# Patient Record
Sex: Male | Born: 1965 | Race: White | Hispanic: No | Smoking: Never smoker
Health system: Southern US, Community
[De-identification: ages and names within clinical notes are randomized; demographics above are authoritative.]

## PROBLEM LIST (undated history)

## (undated) DIAGNOSIS — Z9109 Other allergy status, other than to drugs and biological substances: Secondary | ICD-10-CM

## (undated) DIAGNOSIS — K219 Gastro-esophageal reflux disease without esophagitis: Secondary | ICD-10-CM

---

## 2017-11-28 ENCOUNTER — Encounter (HOSPITAL_BASED_OUTPATIENT_CLINIC_OR_DEPARTMENT_OTHER): Payer: Self-pay | Admitting: Emergency Medicine

## 2017-11-28 ENCOUNTER — Emergency Department (HOSPITAL_BASED_OUTPATIENT_CLINIC_OR_DEPARTMENT_OTHER): Payer: Managed Care, Other (non HMO)

## 2017-11-28 ENCOUNTER — Other Ambulatory Visit: Payer: Self-pay

## 2017-11-28 ENCOUNTER — Emergency Department (HOSPITAL_BASED_OUTPATIENT_CLINIC_OR_DEPARTMENT_OTHER)
Admission: EM | Admit: 2017-11-28 | Discharge: 2017-11-28 | Disposition: A | Discharge status: Home / Self Care | Payer: Managed Care, Other (non HMO) | Attending: Emergency Medicine | Admitting: Emergency Medicine

## 2017-11-28 DIAGNOSIS — R1013 Epigastric pain: Secondary | ICD-10-CM

## 2017-11-28 HISTORY — DX: Gastro-esophageal reflux disease without esophagitis: K21.9

## 2017-11-28 HISTORY — DX: Other allergy status, other than to drugs and biological substances: Z91.09

## 2017-11-28 LAB — CBC
HEMATOCRIT: 43.2 % (ref 39.0–52.0)
Hemoglobin: 14.3 g/dL (ref 13.0–17.0)
MCH: 28.7 pg (ref 26.0–34.0)
MCHC: 33.1 g/dL (ref 30.0–36.0)
MCV: 86.7 fL (ref 80.0–100.0)
NRBC: 0 % (ref 0.0–0.2)
Platelets: 229 10*3/uL (ref 150–400)
RBC: 4.98 MIL/uL (ref 4.22–5.81)
RDW: 12.7 % (ref 11.5–15.5)
WBC: 8.2 10*3/uL (ref 4.0–10.5)

## 2017-11-28 LAB — COMPREHENSIVE METABOLIC PANEL
ALT: 20 U/L (ref 0–44)
ANION GAP: 9 (ref 5–15)
AST: 17 U/L (ref 15–41)
Albumin: 4.2 g/dL (ref 3.5–5.0)
Alkaline Phosphatase: 63 U/L (ref 38–126)
BUN: 11 mg/dL (ref 6–20)
CHLORIDE: 104 mmol/L (ref 98–111)
CO2: 25 mmol/L (ref 22–32)
Calcium: 9.2 mg/dL (ref 8.9–10.3)
Creatinine, Ser: 0.94 mg/dL (ref 0.61–1.24)
GFR calc non Af Amer: >60 mL/min (ref >60)
Glucose, Bld: 96 mg/dL (ref 70–99)
POTASSIUM: 3.6 mmol/L (ref 3.5–5.1)
SODIUM: 138 mmol/L (ref 135–145)
Total Bilirubin: 0.8 mg/dL (ref 0.3–1.2)
Total Protein: 7.1 g/dL (ref 6.5–8.1)

## 2017-11-28 LAB — URINALYSIS, ROUTINE W REFLEX MICROSCOPIC
Bilirubin Urine: NEGATIVE
Glucose, UA: NEGATIVE mg/dL
Hgb urine dipstick: NEGATIVE
Ketones, ur: NEGATIVE mg/dL
LEUKOCYTES UA: NEGATIVE
NITRITE: NEGATIVE
PH: 6 (ref 5.0–8.0)
PROTEIN: NEGATIVE mg/dL
Specific Gravity, Urine: 1.02 (ref 1.005–1.030)

## 2017-11-28 LAB — LIPASE, BLOOD: LIPASE: 32 U/L (ref 11–51)

## 2017-11-28 MED ORDER — SODIUM CHLORIDE 0.9 % IV BOLUS
500.0000 mL | Freq: Once | INTRAVENOUS | Status: AC
  Administered 2017-11-28: 500 mL via INTRAVENOUS

## 2017-11-28 MED ORDER — IOPAMIDOL (ISOVUE-300) INJECTION 61%
100.0000 mL | Freq: Once | INTRAVENOUS | Status: AC | PRN
  Administered 2017-11-28: 100 mL via INTRAVENOUS

## 2017-11-28 NOTE — ED Triage Notes (Signed)
Patient states that he woke up earlier this am with epigastric pain - reports that he is nauseated  - patient has had dizziness - cold chills

## 2017-11-28 NOTE — ED Notes (Signed)
PT transported to CT>

## 2017-11-28 NOTE — Discharge Instructions (Signed)
Continue taking GERD medications as prescribed.  You may try tums or maalox for symptom control.  Use tylenol as needed for pain.  Follow up with your primary care doctor as needed for further evaluation.  Return to the ER if you develop high fevers, persistent vomiting, blood in your stool, or any new or concerning symptoms.

## 2017-11-28 NOTE — ED Provider Notes (Signed)
[facility] EMERGENCY DEPARTMENT Provider Note   CSN: [id number] Arrival date & time: 11/28/17  1533     History   Chief Complaint Chief Complaint  Patient presents with  . Abdominal Pain    HPI Carlota RaspberryGeorge Balfour is a 52 y.o. male presenting for evaluation of epigastric abdominal pain.  Patient states he woke up this morning, he had severe epigastric abdominal pain.  Since then, has been constant.  He reports associated nausea but no vomiting.  He reports chills but no fever.  He states his left ear has been hurting today.  He feels "off."  He denies chest pain or shortness of breath.  He denies lower abdominal pain.  Denies urinary symptoms or abnormal bowel movements.  Patient states he has a history of GERD, takes pantoprazole daily.  Last night he did eat oatmeal just before going to bed, which she normally tries not to eat before sleeping.  He denies tobacco, alcohol, or drug use.  He has a history of an umbilical hernia, no other history of abdominal problems.  He denies history of abdominal surgeries.  Besides GERD, he has no other medical problems, takes no other medications daily.  HPI  Past Medical History:  Diagnosis Date  . Chronic GERD   . Environmental allergies     There are no active problems to display for this patient.   History reviewed. No pertinent surgical history.      Home Medications    Prior to Admission medications   Not on File    Family History History reviewed. No pertinent family history.  Social History Social History   Tobacco Use  . Smoking status: Never Smoker  . Smokeless tobacco: Never Used  Substance Use Topics  . Alcohol use: Never    Frequency: Never  . Drug use: Never     Allergies   Bactrim [sulfamethoxazole-trimethoprim]   Review of Systems Review of Systems  Constitutional: Positive for chills.  HENT: Positive for ear pain.   Gastrointestinal: Positive for abdominal pain and nausea.  All other  systems reviewed and are negative.    Physical Exam Updated Vital Signs BP (!) 145/85 (BP Location: Right Arm)   Pulse 61   Temp 98.3 F (36.8 C) (Oral)   Resp 18   Ht 5\' 9"  (1.753 m)   Wt 73.5 kg   SpO2 100%   BMI 23.92 kg/m   Physical Exam  Constitutional: He is oriented to person, place, and time. He appears well-developed and well-nourished. No distress.  Sitting comfortably in the bed in no acute distress  HENT:  Head: Normocephalic and atraumatic.  Right Ear: Tympanic membrane is not erythematous and not bulging. A middle ear effusion is present.  Left Ear: Tympanic membrane is not erythematous and not bulging. A middle ear effusion is present.  Bilateral ear effusions without erythema or bulging.  OP clear without tonsillar swelling or exudate.  Uvula midline with palate rise.  Eyes: Pupils are equal, round, and reactive to light. Conjunctivae and EOM are normal.  Neck: Normal range of motion. Neck supple.  Cardiovascular: Normal rate, regular rhythm and intact distal pulses.  Pulmonary/Chest: Effort normal and breath sounds normal. No respiratory distress. He has no wheezes.  Abdominal: Soft. He exhibits no distension and no mass. There is no tenderness. There is no rebound and no guarding. A hernia is present.  No tenderness palpation of the abdomen.  Soft without rigidity, guarding, distention.  No masses.  Umbilical hernia soft and  reducible.  Negative rebound.  Musculoskeletal: Normal range of motion.  Neurological: He is alert and oriented to person, place, and time.  Skin: Skin is warm and dry. Capillary refill takes less than 2 seconds.  Psychiatric: He has a normal mood and affect.  Nursing note and vitals reviewed.    ED Treatments / Results  Labs (all labs ordered are listed, but only abnormal results are displayed) Labs Reviewed  LIPASE, BLOOD  COMPREHENSIVE METABOLIC PANEL  CBC  URINALYSIS, ROUTINE W REFLEX MICROSCOPIC    EKG None  Radiology Ct  Abdomen Pelvis W Contrast  Result Date: 11/28/2017 CLINICAL DATA:  Abdominal infection, nausea, vomiting EXAM: CT ABDOMEN AND PELVIS WITH CONTRAST TECHNIQUE: Multidetector CT imaging of the abdomen and pelvis was performed using the standard protocol following bolus administration of intravenous contrast. CONTRAST:  ISOVUE-300 IOPAMIDOL (ISOVUE-300) INJECTION 61% COMPARISON:  None. FINDINGS: Lower chest: Lung bases are clear. No effusions. Heart is normal size. Hepatobiliary: Low-density area in the right hepatic lobe appears to have peripheral contrast enhancement and most likely reflects a hemangioma. Gallbladder unremarkable. No biliary ductal dilatation. Pancreas: No focal abnormality or ductal dilatation. Spleen: No focal abnormality.  Normal size. Adrenals/Urinary Tract: No adrenal abnormality. No focal renal abnormality. No stones or hydronephrosis. Urinary bladder is unremarkable. Stomach/Bowel: Stomach, large and small bowel grossly unremarkable. Appendix not definitively seen. No inflammatory process in the right lower quadrant. Vascular/Lymphatic: No evidence of aneurysm or adenopathy. Reproductive: No visible focal abnormality. Other: No free fluid or free air. Musculoskeletal: No acute bony abnormality. IMPRESSION: 1.6 cm low-density lesion in the right hepatic lobe, favor hemangioma. This could be confirmed with elective right upper quadrant ultrasound. No acute findings in the abdomen or pelvis. Appendix not definitively seen, but no pericecal inflammation visualized. Electronically Signed   By: Charlett Nose M.D.   On: 11/28/2017 18:27    Procedures Procedures (including critical care time)  Medications Ordered in ED Medications  sodium chloride 0.9 % bolus 500 mL (0 mLs Intravenous Stopped 11/28/17 1807)  iopamidol (ISOVUE-300) 61 % injection 100 mL (100 mLs Intravenous Contrast Given 11/28/17 1812)     Initial Impression / Assessment and Plan / ED Course  I have reviewed the  triage vital signs and the nursing notes.  Pertinent labs & imaging results that were available during my care of the patient were reviewed by me and considered in my medical decision making (see chart for details).     Patient presenting for evaluation of abdominal pain and nausea.  Physical exam reassuring, he is afebrile and not tachycardic.  Appears nontoxic.  No tenderness palpation of the abdomen.  Patient does not want anything for pain or for nausea at this time.  Will obtain basic abdominal labs, and CT abdomen pelvis for further evaluation.  Consider pancreatitis versus gastritis versus reflux versus PUD versus obstruction from the hernia.  Labs reassuring, kidney, liver, pancreatic function reassuring.  No leukocytosis.  Urine without infection.  CT abdomen pelvis pending.  CT negative for intra-abdominal infection, perforation, or obstruction.  Appendix is not visible, but no surrounding peri-appendical fluid.  Pain is of the epigastric, not right lower quadrant, low suspicion for appendicitis.  Discussed findings with patient.  Discussed that this may be gastritis versus PUD versus worsening GERD.  Discussed symptomatic control with Maalox or Tums for breakthrough pain, and follow-up with primary care for further evaluation.  Tolerating p.o. without difficulty.  At this time, patient appears safe for safe discharge.  Return precautions given.  Patient states he understands and agrees plan.  Final Clinical Impressions(s) / ED Diagnoses   Final diagnoses:  Epigastric abdominal pain    ED Discharge Orders    None       Alveria Apley, PA-C 11/28/17 1912    Virgina Norfolk, DO 11/28/17 2357

## 2019-03-04 ENCOUNTER — Ambulatory Visit: Payer: Managed Care, Other (non HMO) | Attending: Internal Medicine

## 2019-03-04 DIAGNOSIS — Z23 Encounter for immunization: Secondary | ICD-10-CM | POA: Insufficient documentation

## 2019-03-04 NOTE — Progress Notes (Signed)
   Covid-19 Vaccination Clinic  Name:  Bryant Saye    MRN: [id number] DOB: 1965/03/29  03/04/2019  Mr. Goza was observed post Covid-19 immunization for 15 minutes without incidence. He was provided with Vaccine Information Sheet and instruction to access the V-Safe system.   Mr. Girgis was instructed to call 911 with any severe reactions post vaccine: Marland Kitchen Difficulty breathing  . Swelling of your face and throat  . A fast heartbeat  . A bad rash all over your body  . Dizziness and weakness    Immunizations Administered    Name Date Dose VIS Date Route   Pfizer COVID-19 Vaccine 03/04/2019 11:01 AM 0.3 mL 12/24/2018 Intramuscular   Manufacturer: [employer], [employer]   Lot: [id number]   NDC: [address]-7

## 2019-03-28 ENCOUNTER — Ambulatory Visit: Payer: Managed Care, Other (non HMO) | Attending: Internal Medicine

## 2019-03-28 DIAGNOSIS — Z23 Encounter for immunization: Secondary | ICD-10-CM

## 2019-03-28 NOTE — Progress Notes (Signed)
   Covid-19 Vaccination Clinic  Name:  Troy Richards    MRN: [id number] DOB: January 12, 1966  03/28/2019  Troy Richards was observed post Covid-19 immunization for 15 minutes without incident. He was provided with Vaccine Information Sheet and instruction to access the V-Safe system.   Troy Richards was instructed to call 911 with any severe reactions post vaccine: Marland Kitchen Difficulty breathing  . Swelling of face and throat  . A fast heartbeat  . A bad rash all over body  . Dizziness and weakness   Immunizations Administered    Name Date Dose VIS Date Route   Pfizer COVID-19 Vaccine 03/28/2019  9:25 AM 0.3 mL 12/24/2018 Intramuscular   Manufacturer: [employer], [employer]   Lot: [id number]   NDC: [address]-9

## 2019-12-01 IMAGING — CT CT ABD-PELV W/ CM
2 of 5 series · 16 of 46 positions shown, 18 images · IV contrast (APPLIED)
Comparison: None.

CLINICAL DATA: Abdominal infection, nausea, vomiting

EXAM:
CT ABDOMEN AND PELVIS WITH CONTRAST
TECHNIQUE: Multidetector CT imaging of the abdomen and pelvis was performed
using the standard protocol following bolus administration of
intravenous contrast.
CONTRAST:  100mL [id number] IOPAMIDOL ([id number]-[id number]) INJECTION 61%

[Series 2: axial st · axial · 0.75mm/px · z∈[-414,-24]mm · 13 of 88 slices shown, 15 images]
[im 5/88  soft-tissue]
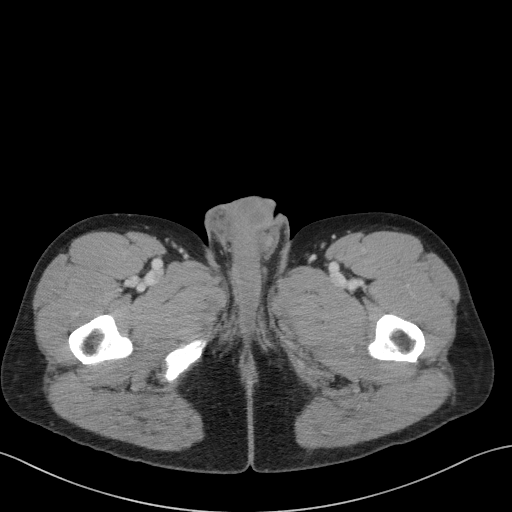
[im 5/88  bone]
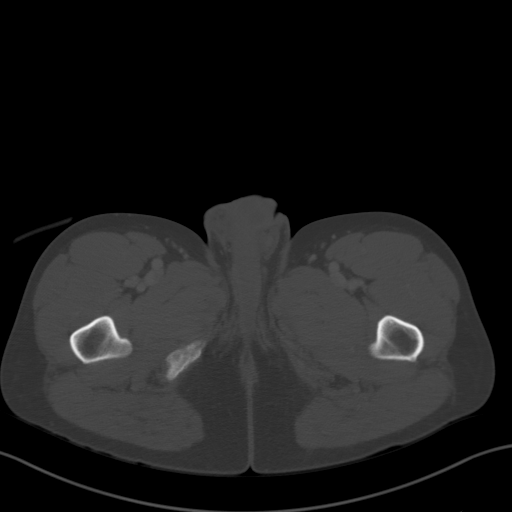
[im 14/88  soft-tissue]
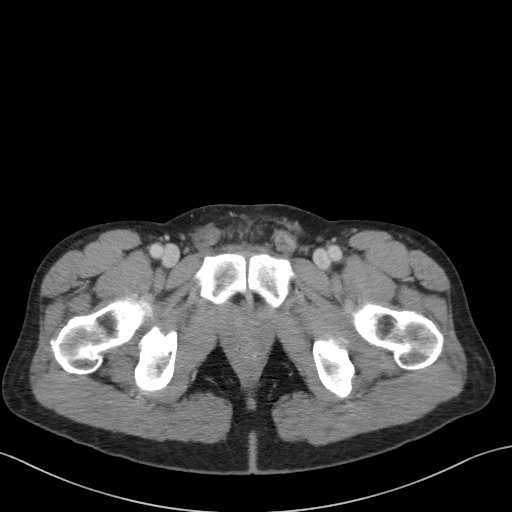
[im 19/88  soft-tissue]
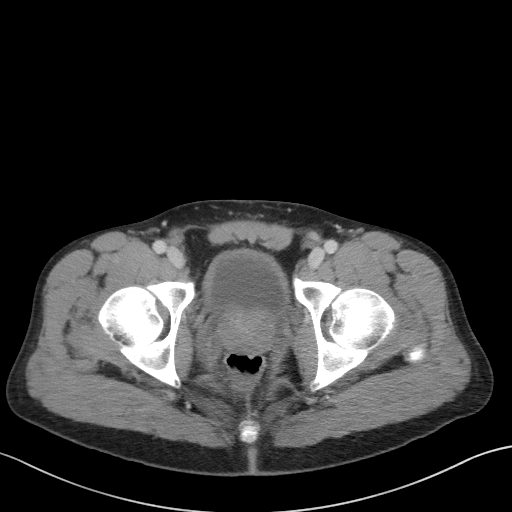
[im 23/88  soft-tissue]
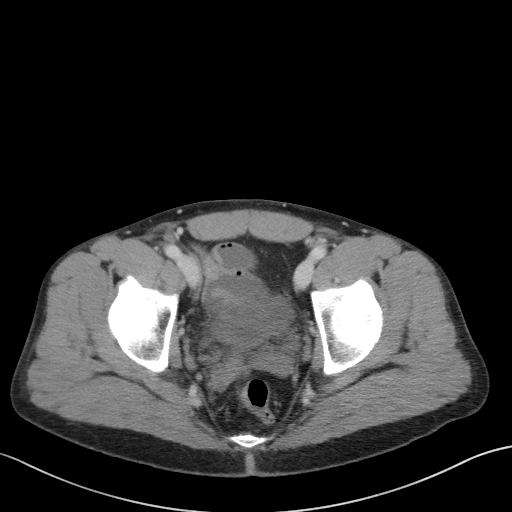
[im 33/88  soft-tissue]
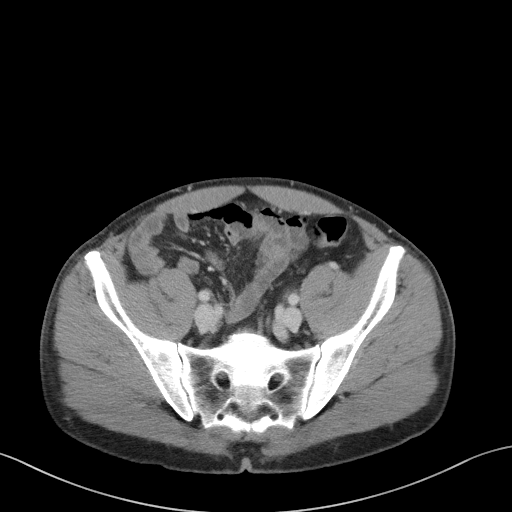
[im 37/88  soft-tissue]
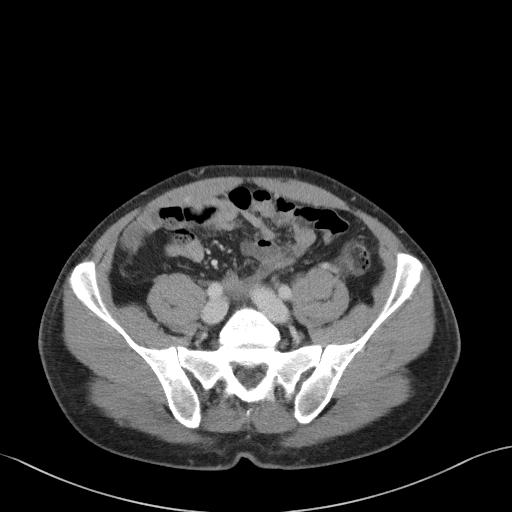
[im 46/88  soft-tissue]
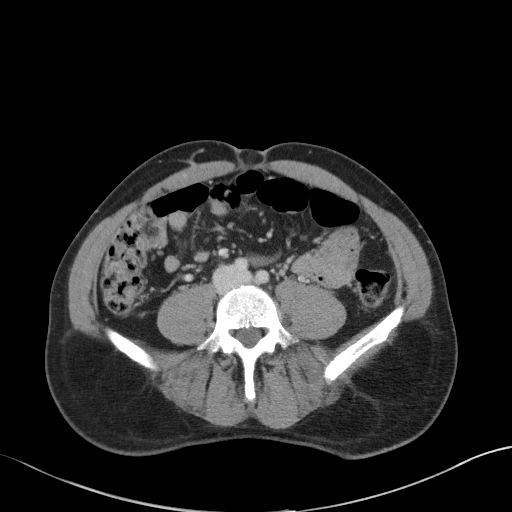
[im 51/88  soft-tissue]
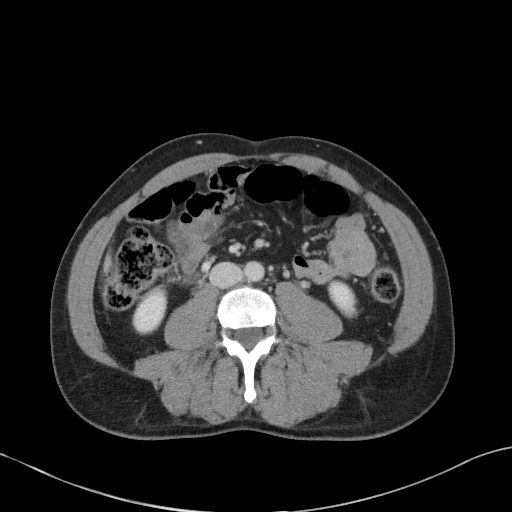
[im 55/88  soft-tissue]
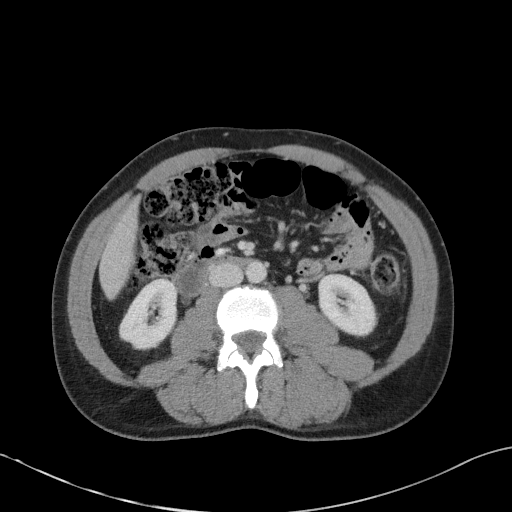
[im 55/88  bone]
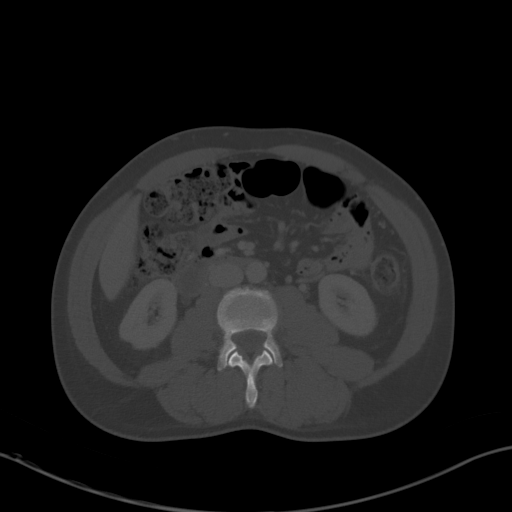
[im 65/88  soft-tissue]
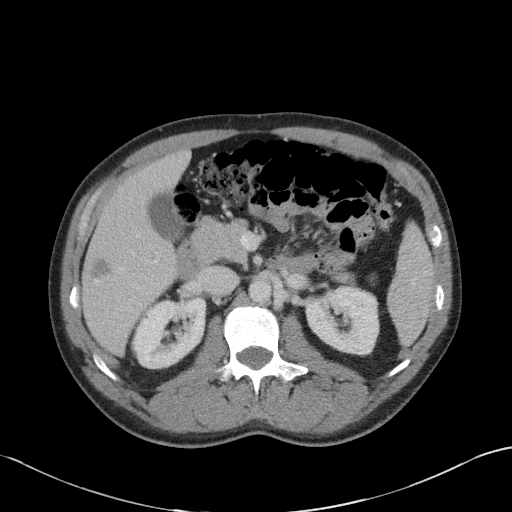
[im 69/88  soft-tissue]
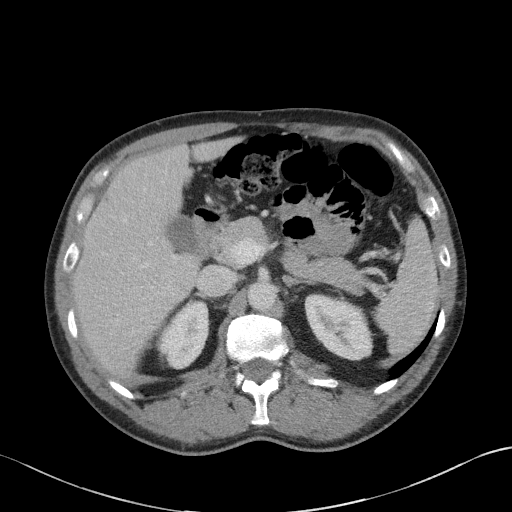
[im 74/88  soft-tissue]
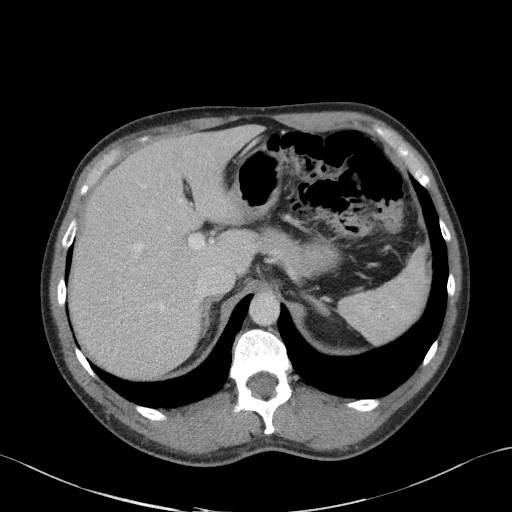
[im 83/88  soft-tissue]
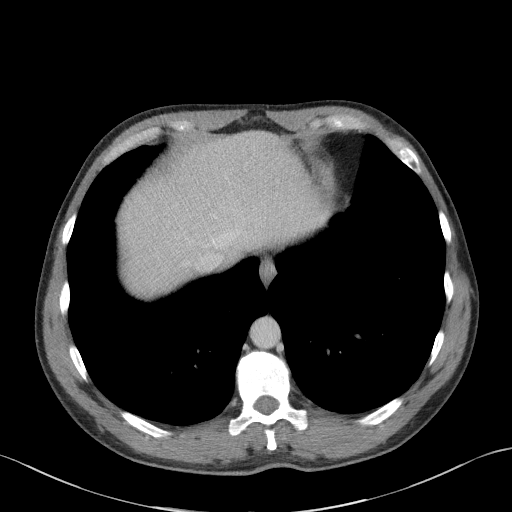

[Series 5: coronal st · coronal · 0.65mm/px · 3 of 87 slices shown]
[im 29/87  soft-tissue]
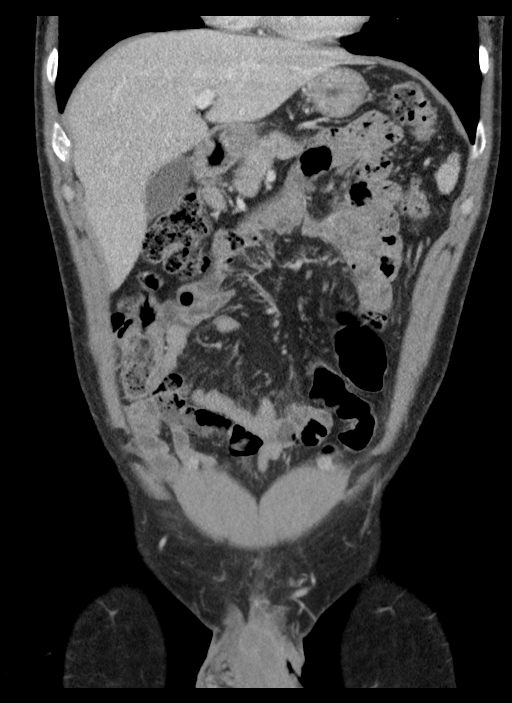
[im 39/87  soft-tissue]
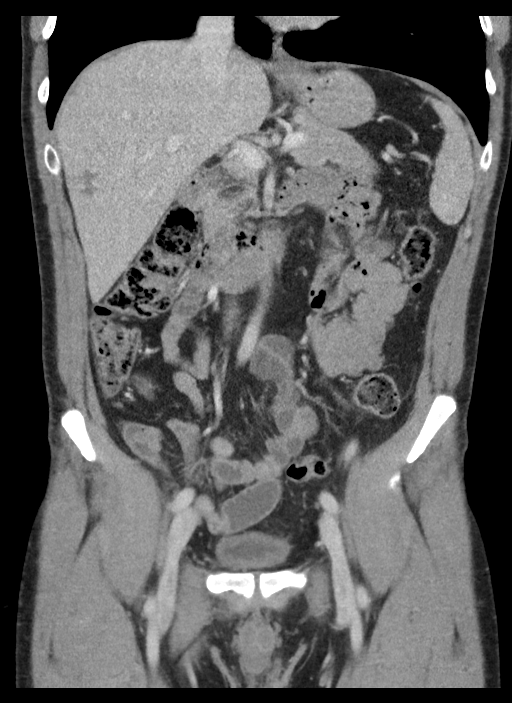
[im 48/87  soft-tissue]
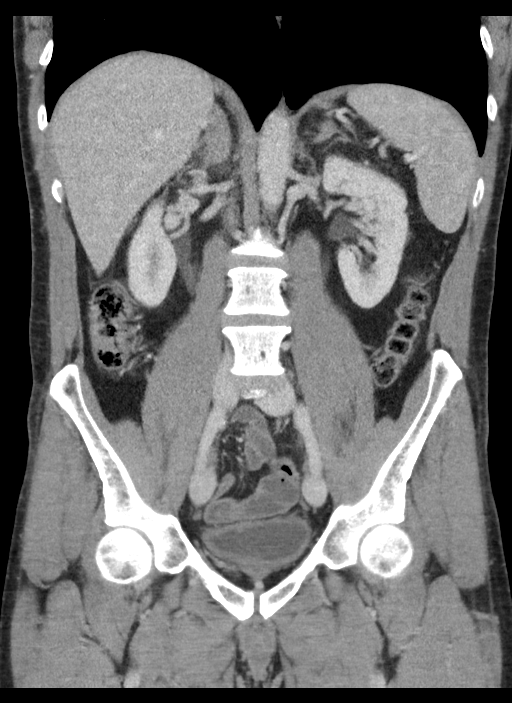

[16 of 46 positions shown; findings below may reference images not displayed]

FINDINGS: Lower chest: Lung bases are clear. No effusions. Heart is normal
size.

Hepatobiliary: Low-density area in the right hepatic lobe appears to
have peripheral contrast enhancement and most likely reflects a
hemangioma. Gallbladder unremarkable. No biliary ductal dilatation.

Pancreas: No focal abnormality or ductal dilatation.

Spleen: No focal abnormality.  Normal size.

Adrenals/Urinary Tract: No adrenal abnormality. No focal renal
abnormality. No stones or hydronephrosis. Urinary bladder is
unremarkable.

Stomach/Bowel: Stomach, large and small bowel grossly unremarkable.
Appendix not definitively seen. No inflammatory process in the right
lower quadrant.

Vascular/Lymphatic: No evidence of aneurysm or adenopathy.

Reproductive: No visible focal abnormality.

Other: No free fluid or free air.

Musculoskeletal: No acute bony abnormality.
IMPRESSION: 1.6 cm low-density lesion in the right hepatic lobe, favor
hemangioma. This could be confirmed with elective right upper
quadrant ultrasound.

No acute findings in the abdomen or pelvis.

Appendix not definitively seen, but no pericecal inflammation
visualized.
